# Patient Record
Sex: Female | Born: 1980 | Hispanic: Yes | Marital: Married | Smoking: Never smoker
Health system: Southern US, Community
[De-identification: ages and names within clinical notes are randomized; demographics above are authoritative.]

## PROBLEM LIST (undated history)

## (undated) DIAGNOSIS — J45909 Unspecified asthma, uncomplicated: Secondary | ICD-10-CM

---

## 2004-12-14 ENCOUNTER — Ambulatory Visit: Payer: Self-pay

## 2004-12-16 ENCOUNTER — Inpatient Hospital Stay: Payer: Self-pay

## 2004-12-16 ENCOUNTER — Observation Stay: Payer: Self-pay

## 2005-12-06 ENCOUNTER — Emergency Department: Payer: Self-pay | Admitting: Unknown Physician Specialty

## 2006-04-12 IMAGING — US US OB US >=[ID] SNGL FETUS
1 series · 17 of 28 positions shown · non-contrast
Comparison: none

REASON FOR EXAM: AFI                 need interpreter
COMMENTS:

[Series 1: us ob us >=(id) sngl fetus · 17 of 96 slices shown]
[im 1/96]
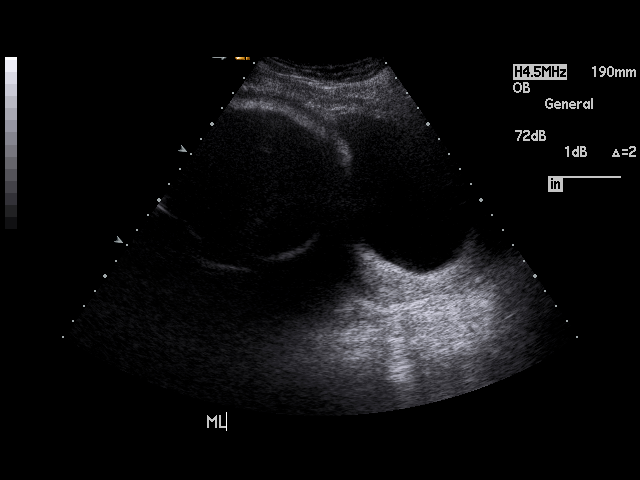
[im 8/96]
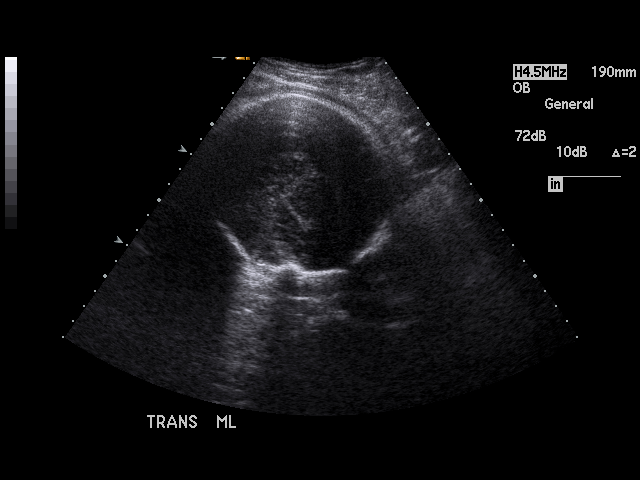
[im 15/96]
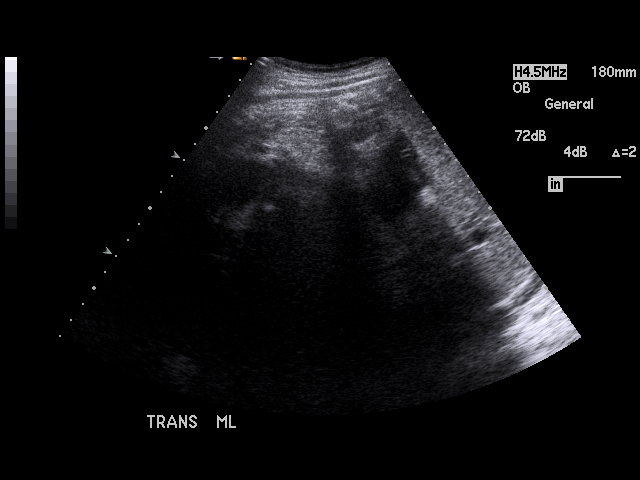
[im 18/96]
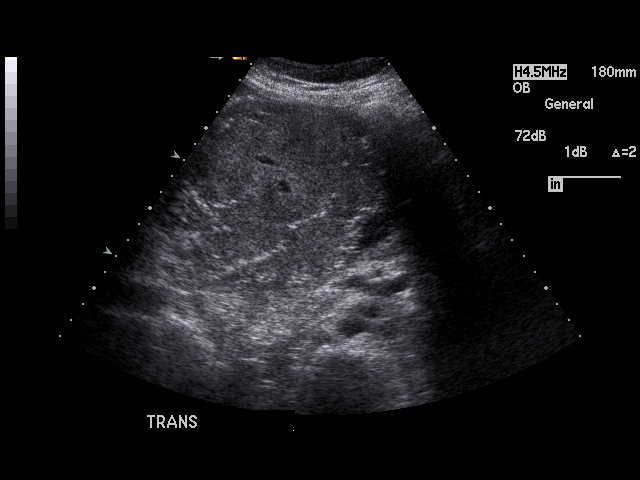
[im 25/96]
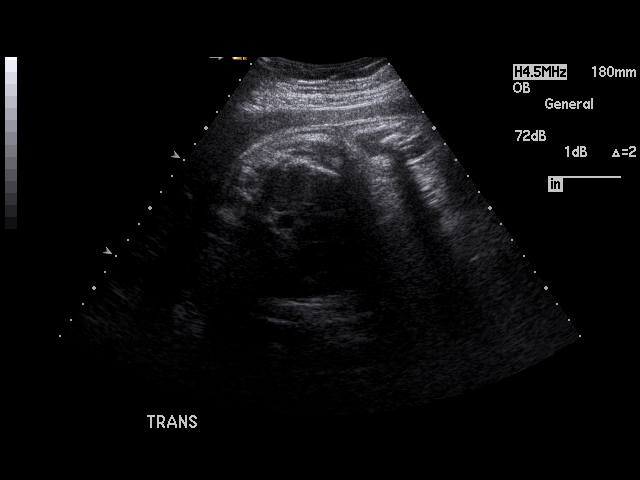
[im 32/96]
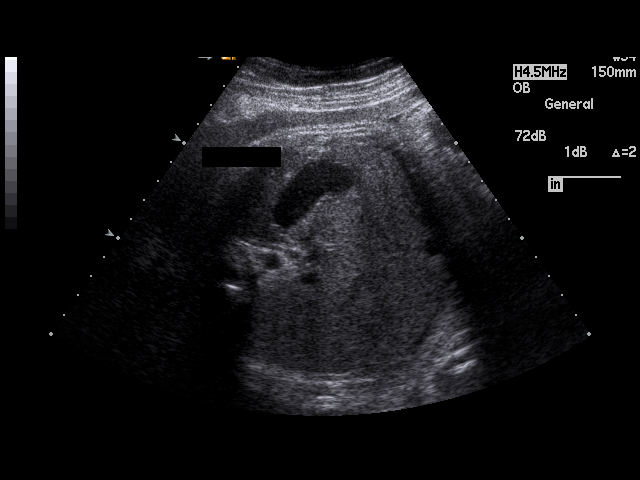
[im 36/96]
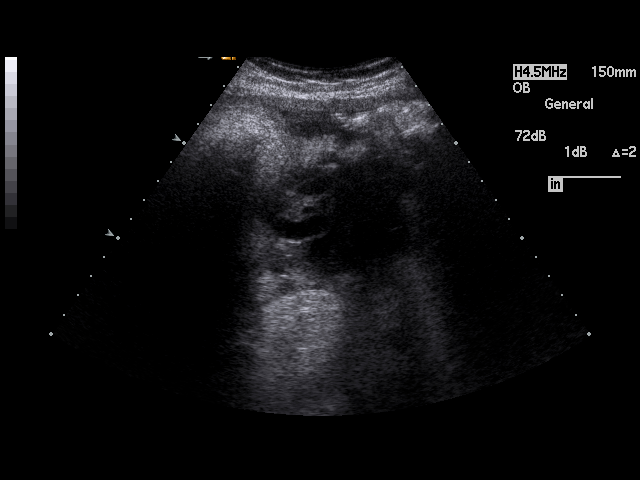
[im 43/96]
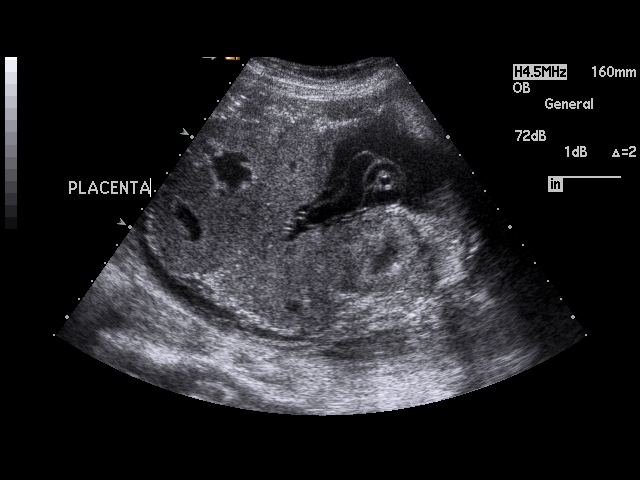
[im 50/96]
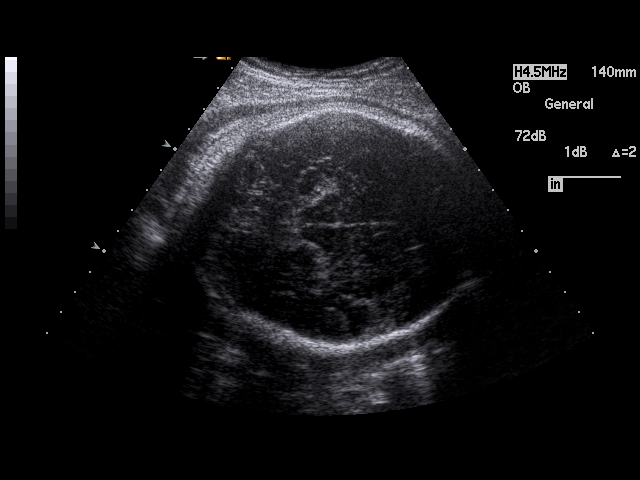
[im 53/96]
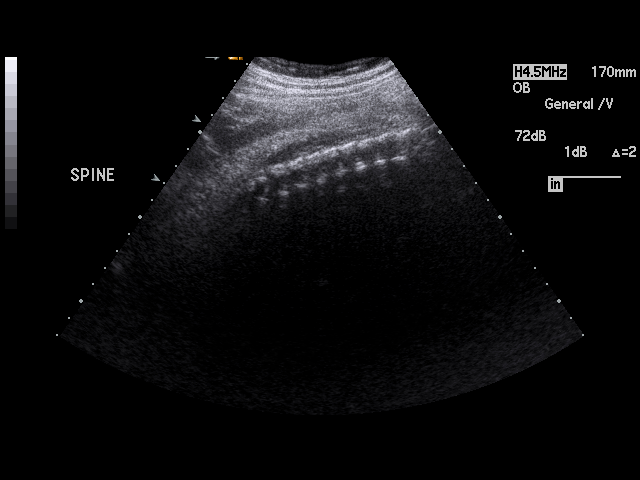
[im 60/96]
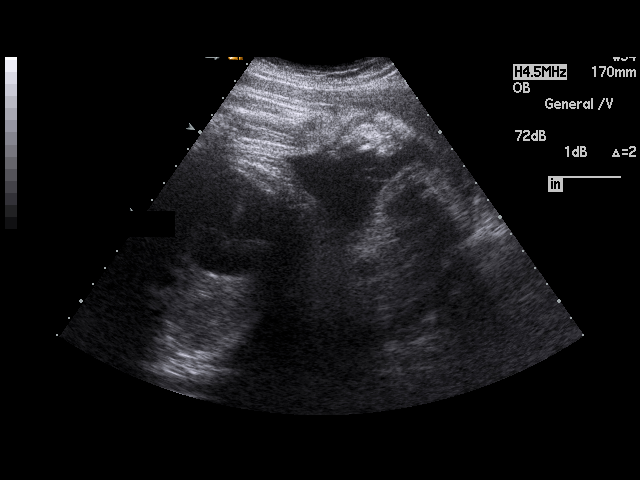
[im 64/96]
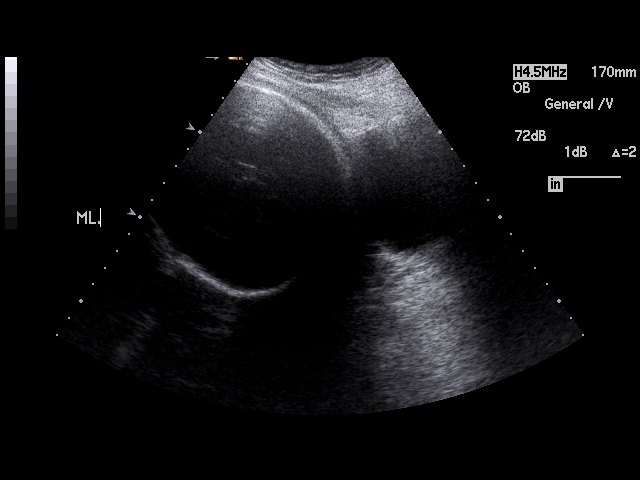
[im 71/96]
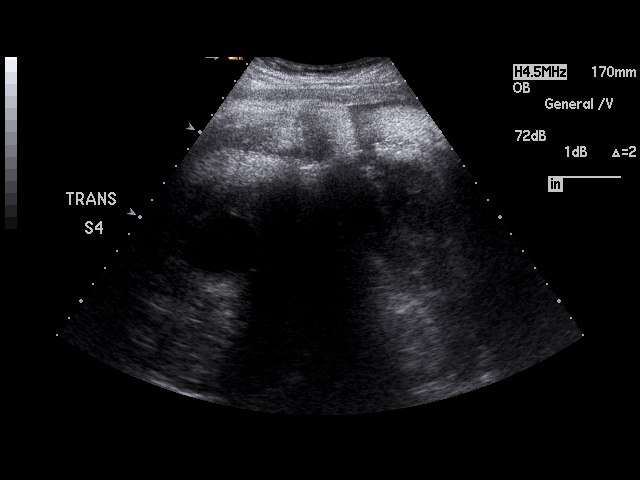
[im 78/96]
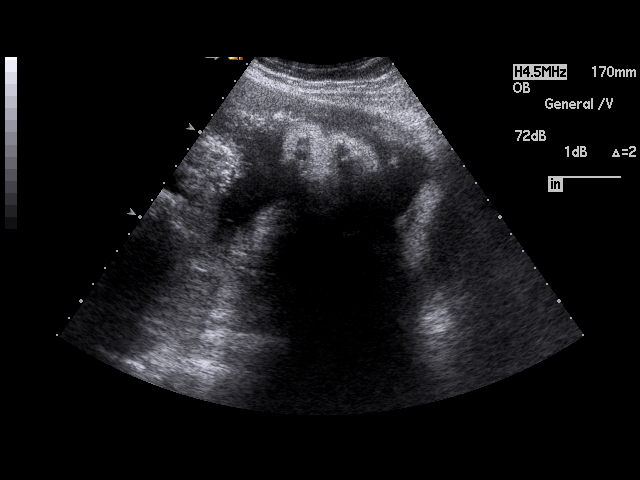
[im 81/96]
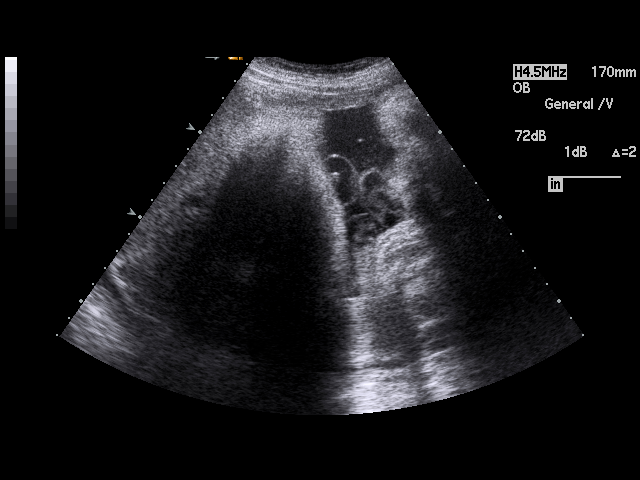
[im 88/96]
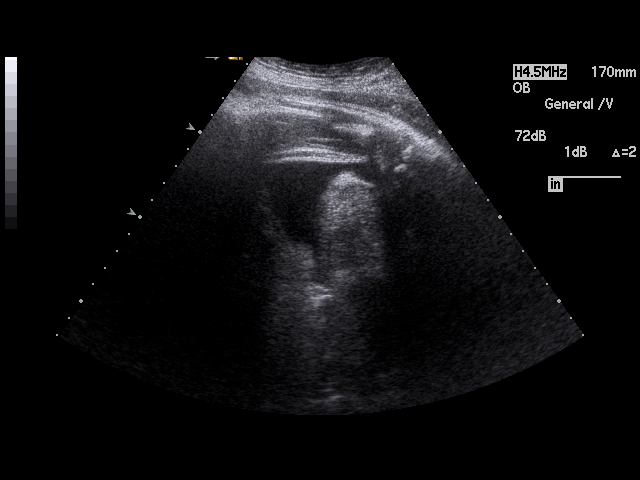
[im 96/96]
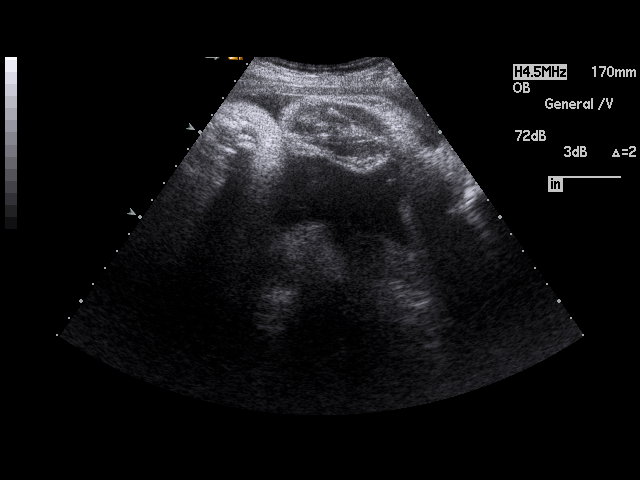

[17 of 28 positions shown; findings below may reference images not displayed]

PROCEDURE:     US  - US PREGNANCY / FETAL AGE  - [birth year] 14, 2004  [DATE]

RESULT:     Real-time imaging is obtained.

A single, viable intrauterine fetus in vertex presentation is identified.
Fetal motion and fetal cardiac motion is observed. The fetal heart rate is
144 beats per minute. The amniotic fluid appears within normal limits. AFI
is 11.56 cm.

The average ultrasound age is 39 weeks, 3 days.

     BPD:   9.65 mm, corresponding to 39 weeks, 3 days
       HC:  34.36 cm, corresponding to 39 weeks, 5 days
       AC:  35.41 cm, corresponding to 39 weeks, 2 days
       FL:    7.72 cm, corresponding to 39 weeks, 3 days

The fetal bladder, kidneys, stomach, four chambered heart and diaphragm are
identified. The spine is difficult to evaluate due to position.

The placenta is located fundally and posteriorly and does not extend to the
lower uterine segment.
IMPRESSION: Single, viable intrauterine fetus with average ultrasound
age of 39 weeks, 3 days. Fetal heart rate is 144 beats per minute.

## 2014-08-06 ENCOUNTER — Ambulatory Visit: Payer: Self-pay | Admitting: Physician Assistant

## 2014-12-16 DIAGNOSIS — O99820 Streptococcus B carrier state complicating pregnancy: Secondary | ICD-10-CM | POA: Insufficient documentation

## 2014-12-16 DIAGNOSIS — Z3480 Encounter for supervision of other normal pregnancy, unspecified trimester: Secondary | ICD-10-CM | POA: Insufficient documentation

## 2014-12-16 DIAGNOSIS — O99019 Anemia complicating pregnancy, unspecified trimester: Secondary | ICD-10-CM | POA: Insufficient documentation

## 2015-12-03 IMAGING — US US OB US >=[ID] SNGL FETUS
1 series · 14 of 28 positions shown · non-contrast
Comparison: none

CLINICAL DATA: Pregnancy.

EXAM:
ULTRASOUND OB >=8S1AX SINGLE FETUS

[Series 1: us ob us >=(id) sngl fetus · 0.25mm/px · 14 of 74 slices shown]
[im 3/74]
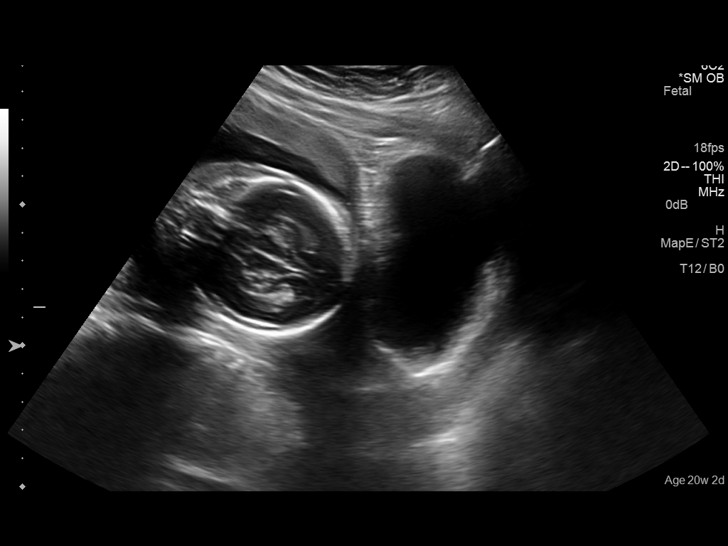
[im 9/74]
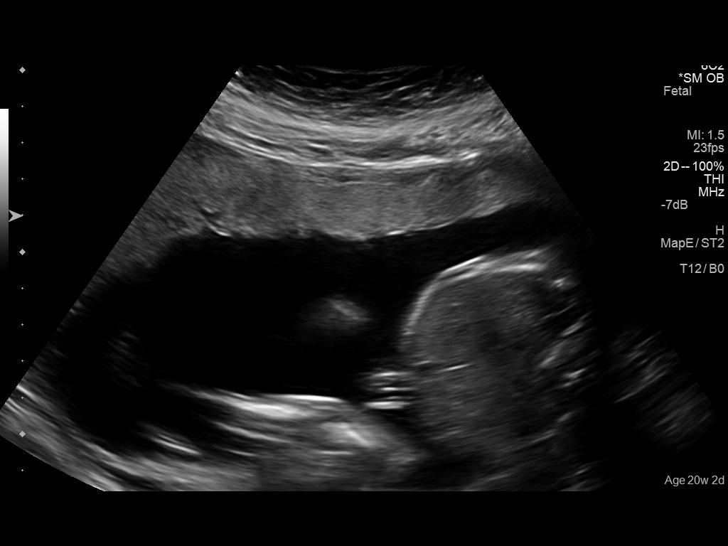
[im 14/74]
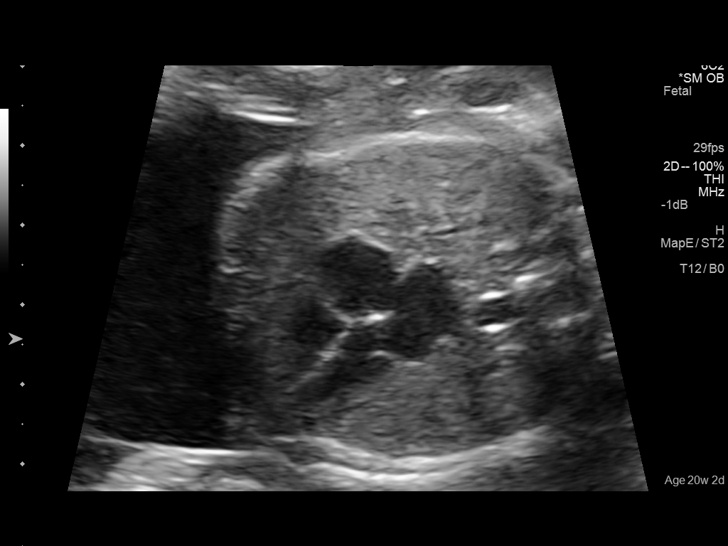
[im 19/74]
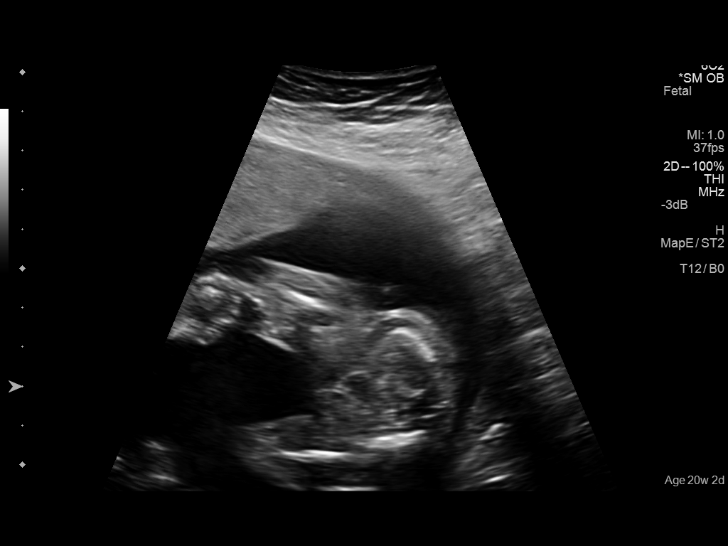
[im 25/74]
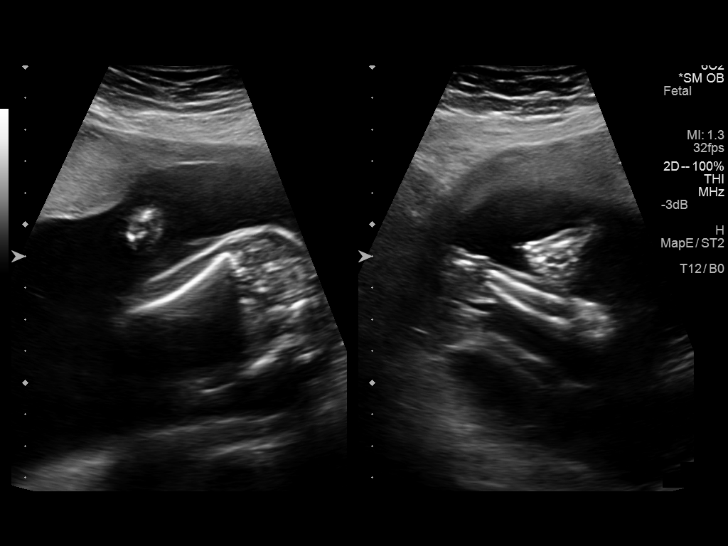
[im 30/74]
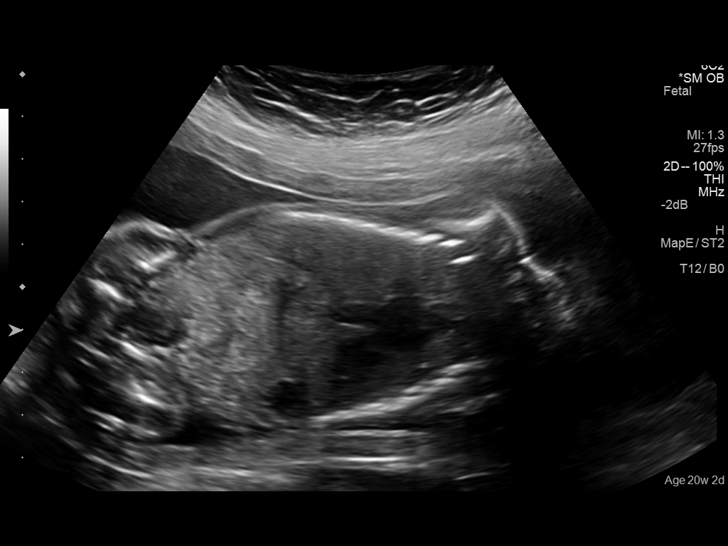
[im 36/74]
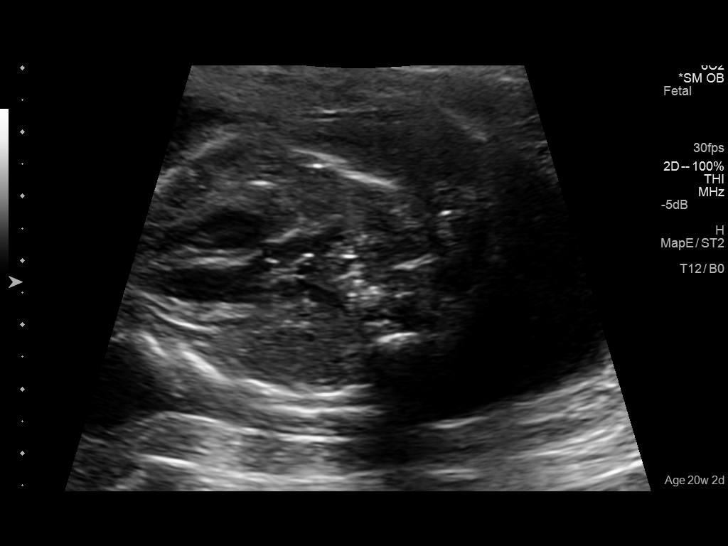
[im 41/74]
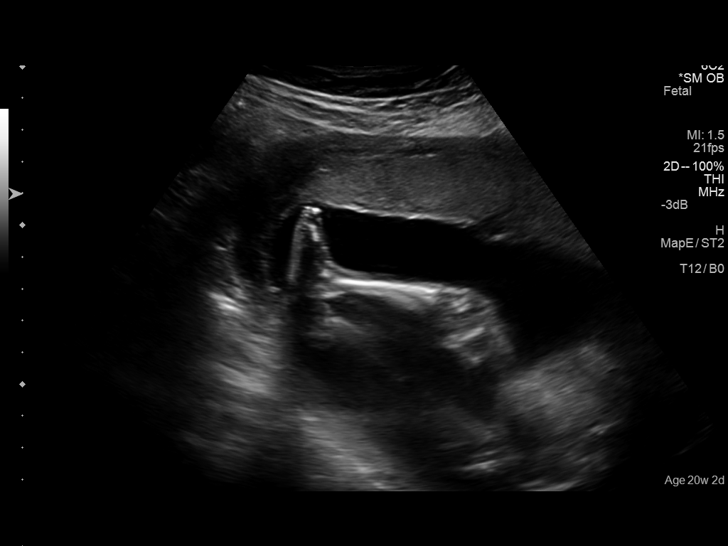
[im 46/74]
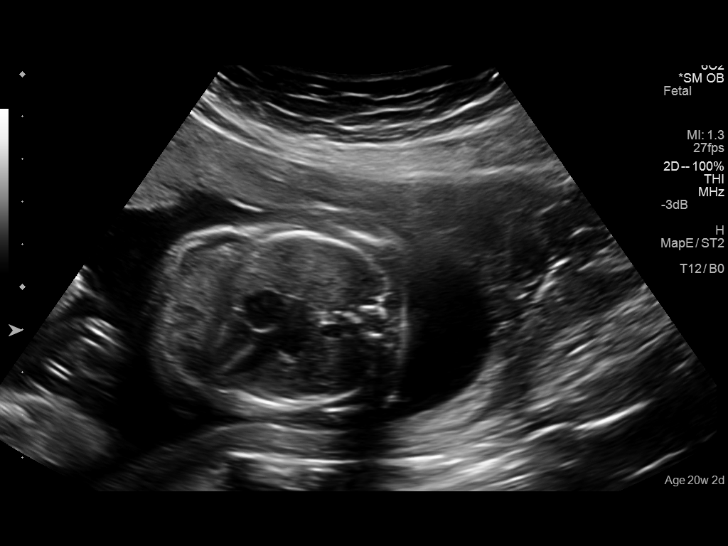
[im 52/74]
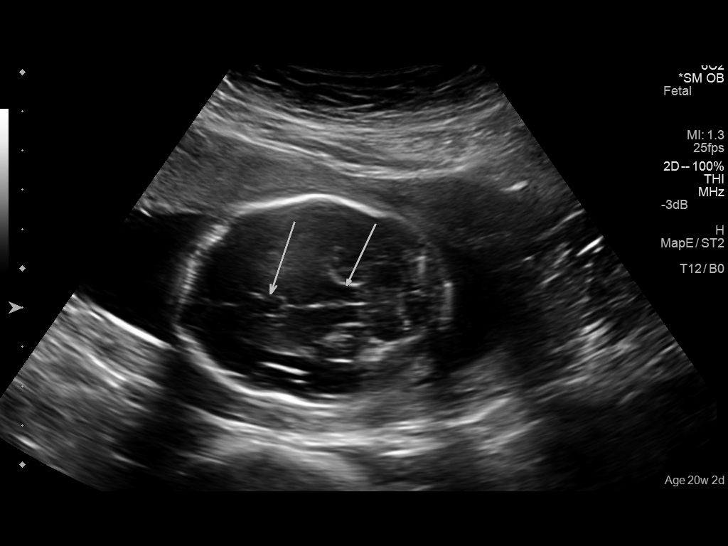
[im 57/74]
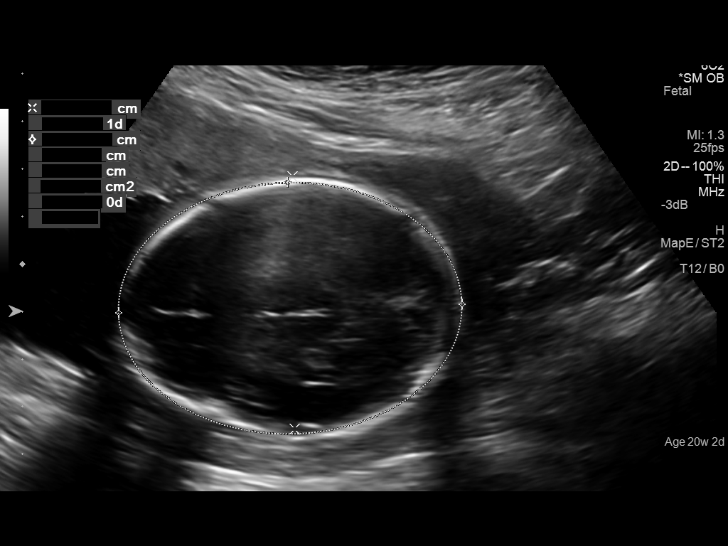
[im 63/74]
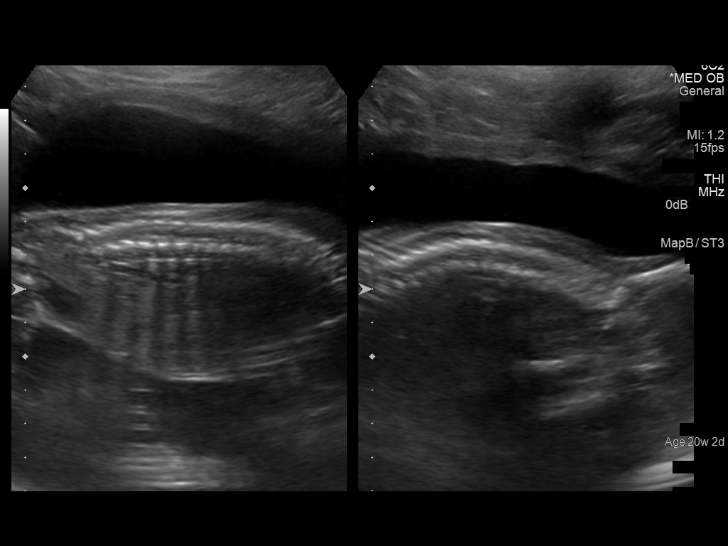
[im 68/74]
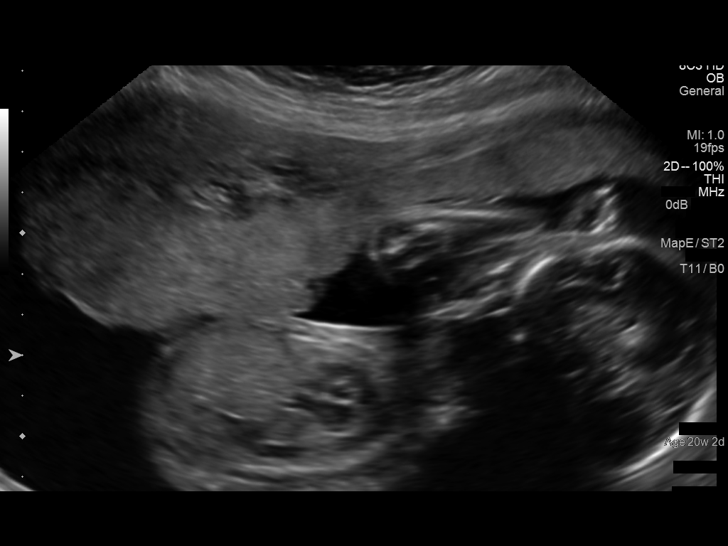
[im 74/74]
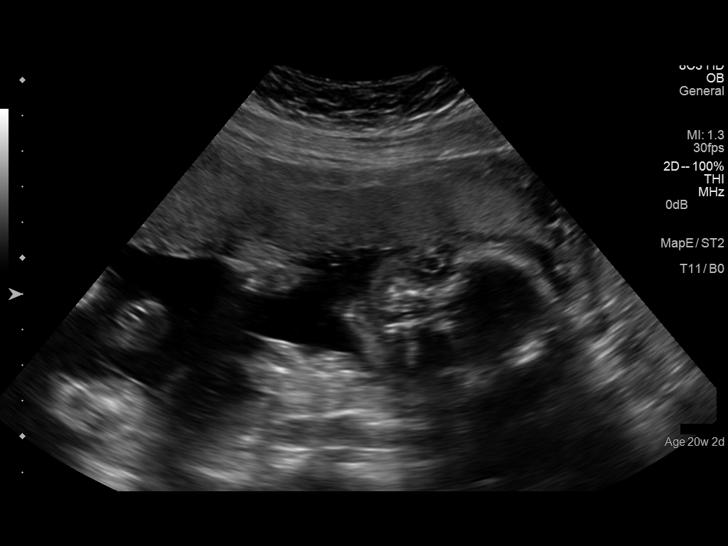

[14 of 28 positions shown; findings below may reference images not displayed]

FINDINGS: Number of Fetuses: 1

Heart Rate:  149 bpm

Movement: Present.

Presentation: Cephalic.

Previa: None.

Placental Location: Anterior.

Amniotic Fluid (Subjective): Normal.

Amniotic Fluid (Objective):

AFI normal.

FETAL BIOMETRY

BPD:  5.3cm 22w 0d

HC:    19.9cm  22w   0d

AC:   18.2cm  23w   0d

FL:   3.7cm  21w   4d

Current Mean GA: 22w 0d

Estimated Fetal Weight:  495g

FETAL ANATOMY

Fetal anatomy evaluation should be performed at a tertiary care or
high risk Center.

Lateral Ventricles: Visualized

Thalami/CSP: Visualized

Posterior Fossa:  Visualized

Nuchal Region: Visualized

Spine: Visualized

Upper Lip: Visualized

4 Chamber Heart on Left: Visualized

Stomach on Left: Visualized

3 Vessel Cord: Visualized

Cord Insertion site: Visualized

Kidneys: Visualized

Bladder: Visualized

Extremities: Visualized

Maternal Findings:

Cervix:  4.4  cm  closed.
IMPRESSION: Single viable intrauterine pregnancy at a mean gestational age of 22
weeks 0 days.

## 2016-06-13 ENCOUNTER — Encounter: Payer: Self-pay | Admitting: Emergency Medicine

## 2016-06-13 ENCOUNTER — Emergency Department
Admission: EM | Admit: 2016-06-13 | Discharge: 2016-06-13 | Disposition: A | Discharge status: Home / Self Care | Payer: Self-pay | Attending: Emergency Medicine | Admitting: Emergency Medicine

## 2016-06-13 DIAGNOSIS — M5412 Radiculopathy, cervical region: Secondary | ICD-10-CM | POA: Insufficient documentation

## 2016-06-13 DIAGNOSIS — J45909 Unspecified asthma, uncomplicated: Secondary | ICD-10-CM | POA: Insufficient documentation

## 2016-06-13 HISTORY — DX: Unspecified asthma, uncomplicated: J45.909

## 2016-06-13 LAB — CBC
HCT: 37.8 % (ref 35.0–47.0)
HEMOGLOBIN: 13.2 g/dL (ref 12.0–16.0)
MCH: 30.6 pg (ref 26.0–34.0)
MCHC: 34.8 g/dL (ref 32.0–36.0)
MCV: 87.7 fL (ref 80.0–100.0)
PLATELETS: 219 10*3/uL (ref 150–440)
RBC: 4.32 MIL/uL (ref 3.80–5.20)
RDW: 12.8 % (ref 11.5–14.5)
WBC: 5.5 10*3/uL (ref 3.6–11.0)

## 2016-06-13 LAB — BASIC METABOLIC PANEL
Anion gap: 5 (ref 5–15)
BUN: 13 mg/dL (ref 6–20)
CHLORIDE: 108 mmol/L (ref 101–111)
CO2: 27 mmol/L (ref 22–32)
CREATININE: 0.58 mg/dL (ref 0.44–1.00)
Calcium: 8.9 mg/dL (ref 8.9–10.3)
GFR calc non Af Amer: >60 mL/min (ref >60)
GLUCOSE: 91 mg/dL (ref 65–99)
Potassium: 3.7 mmol/L (ref 3.5–5.1)
Sodium: 140 mmol/L (ref 135–145)

## 2016-06-13 LAB — URINALYSIS COMPLETE WITH MICROSCOPIC (ARMC ONLY)
Bacteria, UA: NONE SEEN
Bilirubin Urine: NEGATIVE
Glucose, UA: NEGATIVE mg/dL
Ketones, ur: NEGATIVE mg/dL
Leukocytes, UA: NEGATIVE
Nitrite: NEGATIVE
PH: 5 (ref 5.0–8.0)
PROTEIN: NEGATIVE mg/dL
Specific Gravity, Urine: 1.026 (ref 1.005–1.030)

## 2016-06-13 LAB — POCT PREGNANCY, URINE: PREG TEST UR: NEGATIVE

## 2016-06-13 NOTE — ED Provider Notes (Signed)
[facility] Emergency Department Provider Note   ____________________________________________   I have reviewed the triage vital signs and the nursing notes.   HISTORY  Chief Complaint Shoulder Pain and Dizziness   History limited by: Not Limited   HPI Raynaldo OpitzLorena Arcos Felipa FurnaceGarcia is a 35 y.o. female who presents to the emergency department today because of concerns for right shoulder pain as well as dizziness. The patient states she has had right shoulder pain for years. It will come and go. She currently has had this episode for the past month. Is located in the right trapezius and radiates up the right side of her head and down her right arm. She states it has become more severe recently. She presented to the emergency department today because for the past 3 days she has also had problems with dizziness. She has felt weak and fatigued. She denies any fevers. Denies any chest pain or palpitations. Denies any shortness of breath. Denies any known sick contacts.   Past Medical History:  Diagnosis Date  . Asthma     There are no active problems to display for this patient.   History reviewed. No pertinent surgical history.  Prior to Admission medications   Not on File    Allergies Review of patient's allergies indicates no known allergies.  History reviewed. No pertinent family history.  Social History Social History  Substance Use Topics  . Smoking status: Never Smoker  . Smokeless tobacco: Never Used  . Alcohol use No    Review of Systems  Constitutional: Negative for fever. Cardiovascular: Negative for chest pain. Respiratory: Negative for shortness of breath. Gastrointestinal: Negative for abdominal pain, vomiting and diarrhea. Genitourinary: Negative for dysuria. Musculoskeletal: Positive right shoulder pain Skin: Negative for rash. Neurological: Also for right-sided headache  10-point ROS otherwise  negative.  ____________________________________________   PHYSICAL EXAM:  VITAL SIGNS: ED Triage Vitals  Enc Vitals Group     BP 06/13/16 1301 101/62     Pulse Rate 06/13/16 1301 66     Resp 06/13/16 1301 15     Temp 06/13/16 1301 98.1 F (36.7 C)     Temp Source 06/13/16 1301 Oral     SpO2 06/13/16 1301 99 %     Weight 06/13/16 1308 136 lb (61.7 kg)     Height 06/13/16 1308 5' (1.524 m)     Head Circumference --      Peak Flow --      Pain Score 06/13/16 1309 10   Constitutional: Alert and oriented. Well appearing and in no distress. Eyes: Conjunctivae are normal. Normal extraocular movements. ENT   Head: Normocephalic and atraumatic.   Nose: No congestion/rhinnorhea.   Mouth/Throat: Mucous membranes are moist.   Neck: No stridor. Hematological/Lymphatic/Immunilogical: No cervical lymphadenopathy. Cardiovascular: Normal rate, regular rhythm.  No murmurs, rubs, or gallops. Respiratory: Normal respiratory effort without tachypnea nor retractions. Breath sounds are clear and equal bilaterally. No wheezes/rales/rhonchi. Gastrointestinal: Soft and nontender. No distention.  Genitourinary: Deferred Musculoskeletal: Slight tenderness palpation of the right trapezius. No deformities to the right shoulder. No tenderness to palpation of the osseous structures. Neurologic:  Normal speech and language. No gross focal neurologic deficits are appreciated.  Skin:  Skin is warm, dry and intact. No rash noted. Psychiatric: Mood and affect are normal. Speech and behavior are normal. Patient exhibits appropriate insight and judgment.  ____________________________________________    LABS (pertinent positives/negatives)  Labs Reviewed  URINALYSIS COMPLETEWITH MICROSCOPIC ([facility] ONLY) - Abnormal; Notable for the following:  Result Value   Color, Urine YELLOW (*)    APPearance CLEAR (*)    Hgb urine dipstick 2+ (*)    Squamous Epithelial / LPF 0-5 (*)    All other  components within normal limits  BASIC METABOLIC PANEL  CBC  POC URINE PREG, ED  POCT PREGNANCY, URINE     ____________________________________________   EKG  None  ____________________________________________    RADIOLOGY  None   ____________________________________________   PROCEDURES  Procedures  ____________________________________________   INITIAL IMPRESSION / ASSESSMENT AND PLAN / ED COURSE  Pertinent labs & imaging results that were available during my care of the patient were reviewed by me and considered in my medical decision making (see chart for details).  Patient presented to the emergency department today because of concerns for right shoulder pain. This has been chronic. No concerning findings on exam. This patient was concern for some dizziness. Blood work without any concerning findings to explain that. Terms of the shoulder pain with external radiculopathy likely. Will give patient information on this and orthopedic follow-up. In terms of the dizziness not quite clear etiology at this time however I doubt cardiac or intracranial process. We will discharge to follow-up with primary care. ____________________________________________   FINAL CLINICAL IMPRESSION(S) / ED DIAGNOSES  Final diagnoses:  Cervical radiculopathy     Note: This dictation was prepared with Dragon dictation. Any transcriptional errors that result from this process are unintentional    Phineas Semen, MD 06/13/16 1504

## 2016-06-13 NOTE — ED Triage Notes (Signed)
Pt to ED c/o right shoulder pain radiating to right ear and right head.  Pt states pain going on approx 2 months, pt has recurrent pain but states she feels dizzy today which is abnormal for her.  Pt is A&Ox4, speaking in complete and coherent sentences.

## 2016-06-13 NOTE — ED Notes (Signed)
Lights deemed for pt comfort and given a warm blanket..Marland Kitchen

## 2016-06-13 NOTE — Discharge Instructions (Signed)
Please seek medical attention for any high fevers, chest pain, shortness of breath, change in behavior, persistent vomiting, bloody stool or any other new or concerning symptoms.  

## 2021-10-25 ENCOUNTER — Other Ambulatory Visit: Payer: Self-pay

## 2021-10-25 DIAGNOSIS — N644 Mastodynia: Secondary | ICD-10-CM

## 2021-12-06 ENCOUNTER — Other Ambulatory Visit: Payer: Self-pay

## 2021-12-06 ENCOUNTER — Ambulatory Visit: Payer: Self-pay | Attending: Hematology and Oncology

## 2022-02-07 ENCOUNTER — Ambulatory Visit
Admission: RE | Admit: 2022-02-07 | Discharge: 2022-02-07 | Disposition: A | Discharge status: Home / Self Care | Payer: Self-pay | Source: Ambulatory Visit | Attending: Obstetrics and Gynecology | Admitting: Obstetrics and Gynecology

## 2022-02-07 ENCOUNTER — Ambulatory Visit: Payer: Self-pay | Attending: Hematology and Oncology | Admitting: *Deleted

## 2022-02-07 VITALS — BP 106/81 | HR 73 | Resp 18 | Wt 147.0 lb

## 2022-02-07 DIAGNOSIS — N644 Mastodynia: Secondary | ICD-10-CM

## 2022-02-07 DIAGNOSIS — Z1239 Encounter for other screening for malignant neoplasm of breast: Secondary | ICD-10-CM

## 2022-02-07 NOTE — Patient Instructions (Signed)
Explained breast self awareness with Ruffin Pyo. Patient did not need a Pap smear today due to last Pap smear was in 2021 per patient. Let her know BCCCP will cover Pap smears every 3 years unless has a history of abnormal Pap smears. Referred patient to the [facility] for a diagnostic mammogram. Appointment scheduled Wednesday, Feb 07, 2022 at 1020. Patient aware of appointment and will be there. Ariel Waller verbalized understanding.  Mahek Schlesinger, Kathaleen Maser, RN 8:55 AM

## 2022-02-07 NOTE — Progress Notes (Signed)
[name] Ariel Waller is a 41 y.o. female who presents to [facility] clinic today with complaint of left inner breast pain that radiates to her entire breast x 4 months that is constant. Patient rates the pain at a 5 out of 10.   Pap Smear: Pap smear not completed today. Last Pap smear was in 2021 at [facility] and was normal per patient. Per patient has no history of an abnormal Pap smear. Last Pap smear result is not available in Epic.   Physical exam: Breasts Breasts symmetrical. No skin abnormalities bilateral breasts. No nipple retraction bilateral breasts. No nipple discharge bilateral breasts. No lymphadenopathy. No lumps palpated bilateral breasts. Complaints of bilateral inner breast tenderness on exam greater within the left breast.     Pelvic/Bimanual Pap is not indicated today per BCCCP guidelines.   Smoking History: Patient has never smoked.  Patient Navigation: Patient education provided. Access to services provided for patient through [employer] program. Spanish interpreter Delos Haring from [facility] provided.    Breast and Cervical Cancer Risk Assessment: Patient does not have family history of breast cancer, known genetic mutations, or radiation treatment to the chest before age 20. Patient does not have history of cervical dysplasia, immunocompromised, or DES exposure in-utero.  Risk Assessment     Risk Scores       02/07/2022   Last edited by: Lesle Chris, RN   5-year risk: 0.2 %   Lifetime risk: 5.7 %            A: BCCCP exam without pap smear Complaint of left inner breast pain.  P: Referred patient to the [facility] for a diagnostic mammogram. Appointment scheduled Wednesday, Feb 07, 2022 at 1020.  Ariel Heidelberg, RN 02/07/2022 8:55 AM

## 2023-08-14 ENCOUNTER — Encounter: Payer: Self-pay | Admitting: Physician Assistant

## 2023-09-06 ENCOUNTER — Other Ambulatory Visit: Payer: Self-pay

## 2023-09-06 DIAGNOSIS — Z1231 Encounter for screening mammogram for malignant neoplasm of breast: Secondary | ICD-10-CM

## 2023-09-16 ENCOUNTER — Ambulatory Visit: Payer: Self-pay | Attending: Hematology and Oncology | Admitting: *Deleted

## 2023-09-16 ENCOUNTER — Ambulatory Visit
Admission: RE | Admit: 2023-09-16 | Discharge: 2023-09-16 | Disposition: A | Discharge status: Home / Self Care | Payer: Self-pay | Source: Ambulatory Visit | Attending: Obstetrics and Gynecology | Admitting: Obstetrics and Gynecology

## 2023-09-16 VITALS — BP 108/76 | Wt 148.0 lb

## 2023-09-16 DIAGNOSIS — Z1239 Encounter for other screening for malignant neoplasm of breast: Secondary | ICD-10-CM

## 2023-09-16 DIAGNOSIS — Z1231 Encounter for screening mammogram for malignant neoplasm of breast: Secondary | ICD-10-CM

## 2023-09-16 NOTE — Patient Instructions (Signed)
 Explained breast self awareness with Ariel Waller. Patient did not need a Pap smear today due to last Pap smear and HPV typing was 08/13/2023. Let her know BCCCP will cover Pap smears and HPV typing every 5 years unless has a history of abnormal Pap smears. Referred patient to the [facility] for a screening mammogram. Appointment scheduled Monday, September 16, 2023 at 1600. Patient aware of appointment and will be there. Let patient know [name] will follow up with her within the next couple weeks with results of mammogram by letter or phone. Ariel Waller verbalized understanding.  [name], [name], RN 3:43 PM

## 2023-09-16 NOTE — Progress Notes (Signed)
 [name] is a 43 y.o. female who presents to [facility] clinic today with no complaints.    Pap Smear: Pap smear not completed today. Last Pap smear was 08/13/2023 at [facility] clinic and was normal with negative HPV. Per patient has no history of an abnormal Pap smear. Last Pap smear result is not available in Epic.   Physical exam: Breasts Right breast is slightly larger than left breast that per patient has not noticed any changes. No skin abnormalities left breast. Observed a scratch right breast at 6 o'clock 4 cm from the nipple that per patient had a skin tag that she scratched off. No nipple retraction bilateral breasts. No nipple discharge bilateral breasts. No lymphadenopathy. No lumps palpated bilateral breasts. No complaints of pain or tenderness on exam.     MS DIGITAL DIAG TOMO BILAT Result Date: 02/07/2022 CLINICAL DATA:  Here for evaluation of intermittent focal pain in the medial right breast as well as constant nonfocal pain in the left breast. EXAM: DIGITAL DIAGNOSTIC BILATERAL MAMMOGRAM WITH TOMOSYNTHESIS AND CAD; ULTRASOUND RIGHT BREAST LIMITED; ULTRASOUND LEFT BREAST LIMITED TECHNIQUE: Bilateral digital diagnostic mammography and breast tomosynthesis was performed. The images were evaluated with computer-aided detection.; Targeted ultrasound examination of the right breast was performed; Targeted ultrasound examination of the left breast was performed. COMPARISON:  This is the patient's baseline mammogram. ACR Breast Density Category d: The breast tissue is extremely dense, which lowers the sensitivity of mammography. FINDINGS: Spot compression was obtained over the focal areas of pain in both breasts. No suspicious mammographic finding is identified in these areas. No suspicious mass, microcalcification, or other finding is identified in either breast. Targeted bilateral breast ultrasound was performed in the areas of pain in the right breast from the 2 o'clock to the 4  o'clock position and in the left breast from the 8 o'clock to the 10 o'clock position. No suspicious solid or cystic mass is identified in these areas. No findings to explain the patient's symptoms. IMPRESSION: No evidence of malignancy or findings to explain the patient's symptoms. RECOMMENDATION: Any further workup of the patient's symptoms should be based on the clinical assessment. Recommend routine annual screening mammogram beginning at age 45. I have discussed the findings and recommendations with the patient via a Spanish language interpreter. If applicable, a reminder letter will be sent to the patient regarding the next appointment. BI-RADS CATEGORY  1: Negative. Electronically Signed   By: Norman Hopper M.D.   On: 02/07/2022 11:27   Pelvic/Bimanual Pap is not indicated today per BCCCP guidelines.   Smoking History: Patient has never smoked.   Patient Navigation: Patient education provided. Access to services provided for patient through [employer] program. Spanish interpreter Ariel Waller from [facility] provided.    Breast and Cervical Cancer Risk Assessment: Patient does not have family history of breast cancer, known genetic mutations, or radiation treatment to the chest before age 26. Patient does not have history of cervical dysplasia, immunocompromised, or DES exposure in-utero.  Risk Scores as of Encounter on 09/16/2023     Ariel Waller           5-year 0.27%   Lifetime 5.99%            Last calculated by Silas, Ansyi K, CMA on 09/16/2023 at  3:39 PM        A: BCCCP exam without pap smear No complaints.  P: Referred patient to the [facility] for a screening mammogram. Appointment scheduled Monday, September 16, 2023 at 1600.  Driscilla Wanda SQUIBB, RN 09/16/2023 3:43 PM

## 2024-06-18 ENCOUNTER — Encounter: Payer: Self-pay | Admitting: *Deleted

## 2024-06-18 ENCOUNTER — Other Ambulatory Visit: Payer: Self-pay

## 2024-06-18 ENCOUNTER — Emergency Department: Payer: Self-pay

## 2024-06-18 DIAGNOSIS — R519 Headache, unspecified: Secondary | ICD-10-CM | POA: Insufficient documentation

## 2024-06-18 DIAGNOSIS — J45909 Unspecified asthma, uncomplicated: Secondary | ICD-10-CM | POA: Insufficient documentation

## 2024-06-18 LAB — CBC
HCT: 38.6 % (ref 36.0–46.0)
Hemoglobin: 13.8 g/dL (ref 12.0–15.0)
MCH: 31.2 pg (ref 26.0–34.0)
MCHC: 35.8 g/dL (ref 30.0–36.0)
MCV: 87.3 fL (ref 80.0–100.0)
Platelets: 306 K/uL (ref 150–400)
RBC: 4.42 MIL/uL (ref 3.87–5.11)
RDW: 12.1 % (ref 11.5–15.5)
WBC: 7.9 K/uL (ref 4.0–10.5)
nRBC: 0 % (ref 0.0–0.2)

## 2024-06-18 LAB — BASIC METABOLIC PANEL WITH GFR
Anion gap: 13 (ref 5–15)
BUN: 13 mg/dL (ref 6–20)
CO2: 23 mmol/L (ref 22–32)
Calcium: 9.4 mg/dL (ref 8.9–10.3)
Chloride: 101 mmol/L (ref 98–111)
Creatinine, Ser: 0.41 mg/dL — ABNORMAL LOW (ref 0.44–1.00)
GFR, Estimated: >60 mL/min (ref >60)
Glucose, Bld: 131 mg/dL — ABNORMAL HIGH (ref 70–99)
Potassium: 3.2 mmol/L — ABNORMAL LOW (ref 3.5–5.1)
Sodium: 137 mmol/L (ref 135–145)

## 2024-06-18 MED ORDER — ONDANSETRON 4 MG PO TBDP: 4.0000 mg | ORAL_TABLET | Freq: Once | ORAL | Status: DC

## 2024-06-18 MED ORDER — ONDANSETRON 4 MG PO TBDP
ORAL_TABLET | ORAL | Status: AC
  Filled 2024-06-18: qty 1

## 2024-06-18 NOTE — ED Triage Notes (Addendum)
 Pt to triage via wheelchair.  Pt has a headache for 3 days.  Pt taking tylenol without relief.  Pt began vomiting tonight and has vomiting in triage.  Pt alert  speech clear.   Pt took tylenol at 2000.  Pt refused meds for n/v.

## 2024-06-18 NOTE — ED Notes (Signed)
Pt refused zofran odt

## 2024-06-19 ENCOUNTER — Emergency Department
Admission: EM | Admit: 2024-06-19 | Discharge: 2024-06-19 | Disposition: A | Discharge status: Home / Self Care | Payer: Self-pay | Attending: Emergency Medicine | Admitting: Emergency Medicine

## 2024-06-19 DIAGNOSIS — R519 Headache, unspecified: Secondary | ICD-10-CM

## 2024-06-19 LAB — MENINGITIS/ENCEPHALITIS PANEL (CSF)

## 2024-06-19 LAB — RESP PANEL BY RT-PCR (RSV, FLU A&B, COVID)  RVPGX2
Influenza A by PCR: NEGATIVE
Influenza B by PCR: NEGATIVE
Resp Syncytial Virus by PCR: NEGATIVE
SARS Coronavirus 2 by RT PCR: NEGATIVE

## 2024-06-19 LAB — CSF CELL COUNT WITH DIFFERENTIAL
Eosinophils, CSF: 0 %
Eosinophils, CSF: 0 %
Lymphs, CSF: 83 %
Lymphs, CSF: 87 %
Monocyte-Macrophage-Spinal Fluid: 13 %
Monocyte-Macrophage-Spinal Fluid: 17 %
RBC Count, CSF: 0 /mm3 (ref 0–3)
RBC Count, CSF: 5 /mm3 — ABNORMAL HIGH (ref 0–3)
Segmented Neutrophils-CSF: 0 %
Segmented Neutrophils-CSF: 0 %
Tube #: 1
Tube #: 3
WBC, CSF: 5 /mm3 (ref 0–5)
WBC, CSF: 5 /mm3 (ref 0–5)

## 2024-06-19 LAB — HCG, QUANTITATIVE, PREGNANCY: hCG, Beta Chain, Quant, S: 1 m[IU]/mL (ref <5)

## 2024-06-19 LAB — URINALYSIS, W/ REFLEX TO CULTURE (INFECTION SUSPECTED)
Bacteria, UA: NONE SEEN
Bilirubin Urine: NEGATIVE
Glucose, UA: NEGATIVE mg/dL
Hgb urine dipstick: NEGATIVE
Ketones, ur: NEGATIVE mg/dL
Leukocytes,Ua: NEGATIVE
Nitrite: NEGATIVE
Protein, ur: NEGATIVE mg/dL
Specific Gravity, Urine: 1.018 (ref 1.005–1.030)
pH: 8 (ref 5.0–8.0)

## 2024-06-19 LAB — LACTIC ACID, PLASMA: Lactic Acid, Venous: 1.7 mmol/L (ref 0.5–1.9)

## 2024-06-19 LAB — PROTIME-INR
INR: 1 (ref 0.8–1.2)
Prothrombin Time: 13.9 s (ref 11.4–15.2)

## 2024-06-19 LAB — PROTEIN AND GLUCOSE, CSF
Glucose, CSF: 68 mg/dL (ref 40–70)
Total  Protein, CSF: 31 mg/dL (ref 15–45)

## 2024-06-19 MED ORDER — DEXTROSE 5 % IV SOLN
10.0000 mg/kg | Freq: Three times a day (TID) | INTRAVENOUS | Status: DC
  Filled 2024-06-19 (×2): qty 13.4

## 2024-06-19 MED ORDER — SODIUM CHLORIDE 0.9 % IV SOLN
2.0000 g | Freq: Two times a day (BID) | INTRAVENOUS | Status: DC
  Administered 2024-06-19: 2 g via INTRAVENOUS
  Filled 2024-06-19: qty 20

## 2024-06-19 MED ORDER — SODIUM CHLORIDE 0.9 % IV BOLUS (SEPSIS)
1000.0000 mL | Freq: Once | INTRAVENOUS | Status: AC
  Administered 2024-06-19: 1000 mL via INTRAVENOUS

## 2024-06-19 MED ORDER — KETOROLAC TROMETHAMINE 30 MG/ML IJ SOLN
30.0000 mg | Freq: Once | INTRAMUSCULAR | Status: AC
Start: 2024-06-19 — End: 2024-06-19
  Administered 2024-06-19: 30 mg via INTRAVENOUS
  Filled 2024-06-19: qty 1

## 2024-06-19 MED ORDER — VANCOMYCIN HCL IN DEXTROSE 1-5 GM/200ML-% IV SOLN: 1000.0000 mg | Freq: Once | INTRAVENOUS | Status: DC

## 2024-06-19 MED ORDER — POTASSIUM CHLORIDE 20 MEQ PO PACK
40.0000 meq | PACK | Freq: Once | ORAL | Status: AC
  Administered 2024-06-19: 40 meq via ORAL
  Filled 2024-06-19: qty 2

## 2024-06-19 MED ORDER — DEXAMETHASONE SOD PHOSPHATE PF 10 MG/ML IJ SOLN
10.0000 mg | Freq: Four times a day (QID) | INTRAMUSCULAR | Status: DC
  Administered 2024-06-19: 10 mg via INTRAVENOUS

## 2024-06-19 MED ORDER — MORPHINE SULFATE (PF) 4 MG/ML IV SOLN
4.0000 mg | Freq: Once | INTRAVENOUS | Status: AC
  Administered 2024-06-19: 4 mg via INTRAVENOUS
  Filled 2024-06-19: qty 1

## 2024-06-19 MED ORDER — ONDANSETRON 4 MG PO TBDP: 4.0000 mg | ORAL_TABLET | Freq: Four times a day (QID) | ORAL | 0 refills | Status: AC | PRN

## 2024-06-19 MED ORDER — ONDANSETRON HCL 4 MG/2ML IJ SOLN
4.0000 mg | Freq: Once | INTRAMUSCULAR | Status: AC
  Administered 2024-06-19: 4 mg via INTRAVENOUS
  Filled 2024-06-19: qty 2

## 2024-06-19 MED ORDER — VANCOMYCIN HCL 750 MG/150ML IV SOLN
750.0000 mg | Freq: Two times a day (BID) | INTRAVENOUS | Status: DC
  Filled 2024-06-19: qty 150

## 2024-06-19 MED ORDER — SODIUM CHLORIDE 0.9 % IV SOLN: INTRAVENOUS | Status: DC

## 2024-06-19 MED ORDER — IBUPROFEN 800 MG PO TABS: 800.0000 mg | ORAL_TABLET | Freq: Three times a day (TID) | ORAL | 0 refills | Status: AC | PRN

## 2024-06-19 MED ORDER — VANCOMYCIN HCL 750 MG/150ML IV SOLN: 750.0000 mg | Freq: Two times a day (BID) | INTRAVENOUS | Status: DC

## 2024-06-19 MED ORDER — VANCOMYCIN HCL 1500 MG/300ML IV SOLN
1500.0000 mg | Freq: Once | INTRAVENOUS | Status: AC
  Administered 2024-06-19: 1500 mg via INTRAVENOUS
  Filled 2024-06-19: qty 300

## 2024-06-19 NOTE — ED Notes (Signed)
 This RN hand delivered CSF results to lab

## 2024-06-19 NOTE — ED Provider Notes (Signed)
 [facility] Provider Note    Event Date/Time   First MD Initiated Contact with Patient 06/19/24 0033     (approximate)   History   Headache   HPI  Ariel Waller is a 43 y.o. female with history of asthma who presents to the emergency department complaints of generalized throbbing, severe headache ongoing for 3 days.  Has had chills.  Temperature here of 99.8 on arrival.  No improvement with over-the-counter medications at home.  Denies any head injury.  Not on blood thinners.  Does have neck pain, neck stiffness.  No numbness, tingling or weakness.  Denies history of chronic headaches or having a similar headache previously.   History provided by patient and husband.    Past Medical History:  Diagnosis Date   Asthma     History reviewed. No pertinent surgical history.  MEDICATIONS:  Prior to Admission medications   Medication Sig Start Date End Date Taking? Authorizing Provider  valACYclovir (VALTREX) 1000 MG tablet Take 1,000 mg by mouth 2 (two) times daily. 08/13/23   [provider]    Physical Exam   Triage Vital Signs: ED Triage Vitals  Encounter Vitals Group     BP 06/18/24 2131 (!) 149/92     Girls Systolic BP Percentile --      Girls Diastolic BP Percentile --      Boys Systolic BP Percentile --      Boys Diastolic BP Percentile --      Pulse Rate 06/18/24 2131 93     Resp 06/18/24 2131 20     Temp 06/18/24 2131 99.8 F (37.7 C)     Temp Source 06/18/24 2131 Oral     SpO2 06/18/24 2131 99 %     Weight 06/18/24 2129 147 lb 11.3 oz (67 kg)     Height 06/18/24 2129 5' (1.524 m)     Head Circumference --      Peak Flow --      Pain Score 06/18/24 2129 10     Pain Loc --      Pain Education --      Exclude from Growth Chart --     Most recent vital signs: Vitals:   06/19/24 0105 06/19/24 0220  BP:  118/76  Pulse:  98  Resp:  16  Temp: 98.7 F (37.1 C)   SpO2:  96%    CONSTITUTIONAL: Alert, responds  appropriately to questions.  Appears extremely uncomfortable, tearful HEAD: Normocephalic, atraumatic EYES: Conjunctivae clear, pupils appear equal, sclera nonicteric ENT: normal nose; moist mucous membranes NECK: Supple, normal ROM, patient does have pain with movement of her neck CARD: RRR; S1 and S2 appreciated RESP: Normal chest excursion without splinting or tachypnea; breath sounds clear and equal bilaterally; no wheezes, no rhonchi, no rales, no hypoxia or respiratory distress, speaking full sentences ABD/GI: Non-distended; soft, non-tender, no rebound, no guarding, no peritoneal signs BACK: The back appears normal EXT: Normal ROM in all joints; no deformity noted, no edema SKIN: Normal color for age and race; warm; no rash on exposed skin NEURO: Moves all extremities equally, normal speech, normal sensation diffusely, normal gait, no facial asymmetry PSYCH: The patient's mood and manner are appropriate.   ED Results / Procedures / Treatments   LABS: (all labs ordered are listed, but only abnormal results are displayed) Labs Reviewed  BASIC METABOLIC PANEL WITH GFR - Abnormal; Notable for the following components:      Result Value   Potassium  3.2 (*)    Glucose, Bld 131 (*)    Creatinine, Ser 0.41 (*)    All other components within normal limits  CSF CELL COUNT WITH DIFFERENTIAL - Abnormal; Notable for the following components:   Color, CSF CLEAR (*)    Appearance, CSF COLORLESS (*)    All other components within normal limits  CSF CELL COUNT WITH DIFFERENTIAL - Abnormal; Notable for the following components:   Color, CSF CLEAR (*)    Appearance, CSF COLORLESS (*)    RBC Count, CSF 5 (*)    All other components within normal limits  URINALYSIS, W/ REFLEX TO CULTURE (INFECTION SUSPECTED) - Abnormal; Notable for the following components:   Color, Urine YELLOW (*)    APPearance CLEAR (*)    All other components within normal limits  RESP PANEL BY RT-PCR (RSV, FLU A&B,  COVID)  [id number]  CSF CULTURE W GRAM STAIN  CULTURE, BLOOD (ROUTINE X 2)  CULTURE, BLOOD (ROUTINE X 2)  CBC  HCG, QUANTITATIVE, PREGNANCY  PROTEIN AND GLUCOSE, CSF  PROTIME-INR  LACTIC ACID, PLASMA  MENINGITIS/ENCEPHALITIS PANEL (CSF)  LACTIC ACID, PLASMA     EKG:     RADIOLOGY: My personal review and interpretation of imaging: CT head unremarkable.  I have personally reviewed all radiology reports.   CT Head Wo Contrast Result Date: 06/18/2024 CLINICAL DATA:  Headaches EXAM: CT HEAD WITHOUT CONTRAST TECHNIQUE: Contiguous axial images were obtained from the base of the skull through the vertex without intravenous contrast. RADIATION DOSE REDUCTION: This exam was performed according to the departmental dose-optimization program which includes automated exposure control, adjustment of the mA and/or kV according to patient size and/or use of iterative reconstruction technique. COMPARISON:  None Available. FINDINGS: Brain: No evidence of acute infarction, hemorrhage, hydrocephalus, extra-axial collection or mass lesion/mass effect. Vascular: No hyperdense vessel or unexpected calcification. Skull: Normal. Negative for fracture or focal lesion. Sinuses/Orbits: No acute finding. Other: None. IMPRESSION: No acute intracranial abnormality noted. Electronically Signed   By: Oneil Devonshire M.D.   On: 06/18/2024 22:42     PROCEDURES:  Critical Care performed: Yes, see critical care procedure note(s)   CRITICAL CARE Performed by: Josette Lovene Maret   Total critical care time: 30 minutes  Critical care time was exclusive of separately billable procedures and treating other patients.  Critical care was necessary to treat or prevent imminent or life-threatening deterioration.  Critical care was time spent personally by me on the following activities: development of treatment plan with patient and/or surrogate as well as nursing, discussions with consultants, evaluation of patient's response to  treatment, examination of patient, obtaining history from patient or surrogate, ordering and performing treatments and interventions, ordering and review of laboratory studies, ordering and review of radiographic studies, pulse oximetry and re-evaluation of patient's condition.   Lumbar Puncture  Date/Time: 06/19/2024 2:10 AM  Performed by: Darius Fillingim, Josette SAILOR, DO Authorized by: Walker Sitar, Josette SAILOR, DO   Consent:    Consent obtained:  Written   Consent given by:  Patient and spouse   Risks, benefits, and alternatives were discussed: yes     Risks discussed:  Bleeding, infection, repeat procedure, pain, nerve damage and headache Universal protocol:    Procedure explained and questions answered to patient or proxy's satisfaction: yes     Relevant documents present and verified: yes     Test results available: yes     Imaging studies available: yes     Required blood products, implants, devices, and special equipment available:  yes     Immediately prior to procedure a time out was called: yes     Site/side marked: yes     Patient identity confirmed:  Verbally with patient Pre-procedure details:    Procedure purpose:  Diagnostic   Preparation: Patient was prepped and draped in usual sterile fashion   Anesthesia:    Anesthesia method:  Local infiltration   Local anesthetic:  Lidocaine 1% w/o epi Procedure details:    Lumbar space:  L3-L4 interspace   Patient position:  Sitting   Needle gauge:  20   Needle type:  Spinal needle - Quincke tip   Needle length (in):  3.5   Ultrasound guidance: no     Number of attempts:  1   Fluid appearance:  Clear   Tubes of fluid:  4   Total volume (ml):  10 Post-procedure details:    Puncture site:  Adhesive bandage applied   Procedure completion:  Tolerated well, no immediate complications     IMPRESSION / MDM / ASSESSMENT AND PLAN / ED COURSE  I reviewed the triage vital signs and the nursing notes.    Patient here with severe, gradual onset  headache for 3 days.  Temperature of 99.8.  Has meningismus on exam.    DIFFERENTIAL DIAGNOSIS (includes but not limited to):   Meningitis, encephalitis, migraine, tension headache, intracranial hemorrhage, cavernous sinus thrombosis, doubt stroke   Patient's presentation is most consistent with acute presentation with potential threat to life or bodily function.   PLAN: Labs obtained from triage are reassuring with no leukocytosis, normal electrolytes and creatinine.  CT head reviewed and interpreted by myself and the radiologist and is unremarkable.  I am quite concerned however given her clinical presentation for meningitis.  Discussed risk and benefits of lumbar puncture and patient and husband agree to proceed.  Will give pain medication and start empiric antibiotics and antiviral medications.   MEDICATIONS GIVEN IN ED: Medications  dexamethasone (DECADRON) injection 10 mg (10 mg Intravenous Given 06/19/24 0127)  cefTRIAXone (ROCEPHIN) 2 g in sodium chloride 0.9 % 100 mL IVPB (0 g Intravenous Stopped 06/19/24 0218)  0.9 %  sodium chloride infusion ( Intravenous New Bag/Given 06/19/24 0250)  vancomycin (VANCOREADY) IVPB 1500 mg/300 mL (1,500 mg Intravenous New Bag/Given 06/19/24 0252)  vancomycin (VANCOREADY) IVPB 750 mg/150 mL (has no administration in time range)  ketorolac (TORADOL) 30 MG/ML injection 30 mg (30 mg Intravenous Given 06/19/24 0129)  ondansetron (ZOFRAN) injection 4 mg (4 mg Intravenous Given 06/19/24 0129)  morphine (PF) 4 MG/ML injection 4 mg (4 mg Intravenous Given 06/19/24 0129)  sodium chloride 0.9 % bolus 1,000 mL (0 mLs Intravenous Stopped 06/19/24 0328)  potassium chloride (KLOR-CON) packet 40 mEq (40 mEq Oral Given 06/19/24 0252)  morphine (PF) 4 MG/ML injection 4 mg (4 mg Intravenous Given 06/19/24 0337)     ED COURSE: COVID, flu and RSV negative.  CFS shows no sign of meningitis or intracranial hemorrhage.  Pain has been well-controlled here and she is  resting comfortably.  Will discharge with prescriptions for ibuprofen, Zofran and recommended follow-up with her primary care doctor.  At this time, I do not feel there is any life-threatening condition present. I reviewed all nursing notes, vitals, pertinent previous records.  All lab and urine results, EKGs, imaging ordered have been independently reviewed and interpreted by myself.  I reviewed all available radiology reports from any imaging ordered this visit.  Based on my assessment, I feel the patient is safe to  be discharged home without further emergent workup and can continue workup as an outpatient as needed. Discussed all findings, treatment plan as well as usual and customary return precautions.  They verbalize understanding and are comfortable with this plan.  Outpatient follow-up has been provided as needed.  All questions have been answered.    CONSULTS:  none   OUTSIDE RECORDS REVIEWED: Reviewed last family medicine note in April 2024.  Patient was seen at that time for headaches, fever.       FINAL CLINICAL IMPRESSION(S) / ED DIAGNOSES   Final diagnoses:  Bad headache     Rx / DC Orders   ED Discharge Orders          Ordered    ibuprofen (ADVIL) 800 MG tablet  Every 8 hours PRN        06/19/24 0437    ondansetron (ZOFRAN-ODT) 4 MG disintegrating tablet  Every 6 hours PRN        06/19/24 0437             Note:  This document was prepared using Dragon voice recognition software and may include unintentional dictation errors.   Cagney Degrace, Josette SAILOR, DO 06/19/24 [phone]

## 2024-06-19 NOTE — Discharge Instructions (Addendum)
 You may alternate over the counter Tylenol 1000 mg every 6 hours as needed for pain, fever and Ibuprofen 800 mg every 6-8 hours as needed for pain, fever.  Please take Ibuprofen with food.  Do not take more than 4000 mg of Tylenol (acetaminophen) in a 24 hour period.

## 2024-06-19 NOTE — Progress Notes (Signed)
 Pharmacy Antibiotic Note  Ariel Waller is a 43 y.o. female admitted on 06/19/2024 with meningitis.  Pharmacy has been consulted for Vancomycin, Acyclovir dosing.   Will   Plan: Acyclovir 670 mg (10 mg/kg TBW) IV Q8H ordered to start on 10/10 @ ~ 0200.  Vancomycin 1500 mg IV X 1 loading dose ordered to start on 10/10 @ 0200. Vancomycin 750 mg IV Q12H ordered to start on 10/10 @ 1500.  - will dose by nomogram instead of AUC   Height: 5' (152.4 cm) Weight: 67 kg (147 lb 11.3 oz) IBW/kg (Calculated) : 45.5  Temp (24hrs), Avg:99.3 F (37.4 C), Min:98.7 F (37.1 C), Max:99.8 F (37.7 C)  Recent Labs  Lab 06/18/24 2135  WBC 7.9  CREATININE 0.41*    Estimated Creatinine Clearance: 79.8 mL/min (A) (by C-G formula based on SCr of 0.41 mg/dL (L)).    No Known Allergies  Antimicrobials this admission:   >>    >>   Dose adjustments this admission:   Microbiology results:  BCx:   UCx:    Sputum:    MRSA PCR:   Thank you for allowing pharmacy to be a part of this patient's care.  Casimir Barcellos D 06/19/2024 2:25 AM

## 2024-06-22 LAB — CSF CULTURE W GRAM STAIN: Culture: NO GROWTH

## 2024-06-24 LAB — CULTURE, BLOOD (ROUTINE X 2)
Culture: NO GROWTH
Culture: NO GROWTH
